# Patient Record
Sex: Male | Born: 1995 | Race: White | Hispanic: No | Marital: Single | Smoking: Current every day smoker
Health system: Southern US, Community
[De-identification: ages and names within clinical notes are randomized; demographics above are authoritative.]

---
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
## 2013-09-27 ENCOUNTER — Emergency Department (HOSPITAL_COMMUNITY)
Admission: EM | Admit: 2013-09-27 | Discharge: 2013-09-27 | Disposition: A | Discharge status: Home / Self Care | Payer: 59 | Attending: Emergency Medicine | Admitting: Emergency Medicine

## 2013-09-27 ENCOUNTER — Encounter (HOSPITAL_COMMUNITY): Payer: Self-pay | Admitting: Emergency Medicine

## 2013-09-27 DIAGNOSIS — F1110 Opioid abuse, uncomplicated: Secondary | ICD-10-CM | POA: Insufficient documentation

## 2013-09-27 DIAGNOSIS — F1410 Cocaine abuse, uncomplicated: Secondary | ICD-10-CM | POA: Insufficient documentation

## 2013-09-27 DIAGNOSIS — F1910 Other psychoactive substance abuse, uncomplicated: Secondary | ICD-10-CM

## 2013-09-27 DIAGNOSIS — IMO0001 Reserved for inherently not codable concepts without codable children: Secondary | ICD-10-CM | POA: Insufficient documentation

## 2013-09-27 DIAGNOSIS — R6883 Chills (without fever): Secondary | ICD-10-CM | POA: Insufficient documentation

## 2013-09-27 DIAGNOSIS — F17200 Nicotine dependence, unspecified, uncomplicated: Secondary | ICD-10-CM | POA: Insufficient documentation

## 2013-09-27 DIAGNOSIS — Z880 Allergy status to penicillin: Secondary | ICD-10-CM | POA: Insufficient documentation

## 2013-09-27 NOTE — ED Notes (Signed)
Pt states he wants detox from opiates.  Has been using for 1 year.  No prior detox.  Opiates last this morning.  No detox symptoms noted.

## 2013-09-27 NOTE — Discharge Instructions (Signed)
Please read and follow all provided instructions.  Your diagnoses today include:  1. Polysubstance abuse     Tests performed today include:  Vital signs. See below for your results today.   Medications prescribed:   None  Home care instructions:  Follow any educational materials contained in this packet.  Follow-up instructions: Follow-up with the referrals given to you for treatment.   Return instructions:   Please return to the Emergency Department if you experience worsening symptoms.   Please return if you have any other emergent concerns.  Additional Information:  Your vital signs today were: BP 118/69   Pulse 95   Temp(Src) 98 F (36.7 C) (Oral)   Resp 16   SpO2 99% If your blood pressure (BP) was elevated above 135/85 this visit, please have this repeated by your doctor within one month. ---------------

## 2013-09-27 NOTE — ED Provider Notes (Signed)
CSN: [id number]     Arrival date & time 09/27/13  1224 History  This chart was scribed for Rhea Bleacher PA-C  working with Merrie Roof, MD by Ashley Jacobs, ED scribe. This patient was seen in room [facility] and the patient's care was started at 1:28 PM.   First MD Initiated Contact with Patient 09/27/13 1256     Chief Complaint  Patient presents with  . Drug Problem     (Consider location/radiation/quality/duration/timing/severity/associated sxs/prior Treatment) Patient is a 18 y.o. male presenting with drug problem. The history is provided by the patient and medical records. No language interpreter was used.  Drug Problem Pertinent negatives include no chest pain, no abdominal pain and no headaches.   HPI Comments: Jeremy Fernandez is a 18 y.o. male who presents to the Emergency Department for detox from opiates. Pt has been using heroin and cocaine for the past year. Pt's last use was six hours ago. He injects into his arms. Denies any swelling or redness. Pt has generalized body aches and chills.  Denies prior detox treatment. Denies SI and HI. Denies fever, nausea and vomitnig.   History reviewed. No pertinent past medical history. Past Surgical History  Procedure Laterality Date  . Orif finger fracture  01/14/2011    Procedure: OPEN REDUCTION INTERNAL FIXATION (ORIF) METACARPAL (FINGER) FRACTURE;  Surgeon: Tami Ribas;  Location: [facility];  Service: Orthopedics;  Laterality: Right;  crpp vs orif rt small metacarpal fx    Family History  Problem Relation Age of Onset  . Asthma Mother   . Miscarriages / [place] Mother   . Hypertension Father   . Hyperlipidemia Father    History  Substance Use Topics  . Smoking status: Current Every Day Smoker -- 1.00 packs/day    Types: Cigarettes  . Smokeless tobacco: Not on file  . Alcohol Use: Yes    Review of Systems  Constitutional: Positive for chills. Negative for fever.  HENT: Negative for  rhinorrhea and sore throat.   Eyes: Negative for redness.  Respiratory: Negative for cough.   Cardiovascular: Negative for chest pain.  Gastrointestinal: Negative for nausea, vomiting, abdominal pain and diarrhea.  Genitourinary: Negative for dysuria.  Musculoskeletal: Positive for myalgias.  Skin: Positive for wound. Negative for color change and rash.  Neurological: Negative for headaches.  Psychiatric/Behavioral: Negative for suicidal ideas.       No HI       Allergies  Penicillins  Home Medications   Prior to Admission medications   Not on File   BP 118/69  Pulse 95  Temp(Src) 98 F (36.7 C) (Oral)  Resp 16  SpO2 99% Physical Exam  Nursing note and vitals reviewed. Constitutional: He appears well-developed and well-nourished.  HENT:  Head: Normocephalic and atraumatic.  Eyes: Conjunctivae are normal. Right eye exhibits no discharge. Left eye exhibits no discharge.  Neck: Normal range of motion. Neck supple.  Cardiovascular: Normal rate, regular rhythm and normal heart sounds.   Pulmonary/Chest: Effort normal and breath sounds normal.  Abdominal: Soft. There is no tenderness.  Neurological: He is alert.  Skin: Skin is warm and dry.  Track marks bilateral arms without signs of cellulitis or abscess.  Psychiatric: He has a normal mood and affect.    ED Course  Procedures (including critical care time) DIAGNOSTIC STUDIES: Oxygen Saturation is 99% on RA, normal by my interpretation.    COORDINATION OF CARE:  1:31 PM Discussed course of care with pt which includes a  consult with Behavioral Health . Pt understands and agrees.   Labs Review Labs Reviewed - No data to display  Imaging Review No results found.   EKG Interpretation None      Patient seen and examined. Nurse Johnny BridgeMartha called back to assessment team. Currently no inpatient detox for opiates alone. They have brought up resources for patient which I have provided to him.    Vital signs reviewed  and are as follows: Filed Vitals:   09/27/13 1246  BP: 118/69  Pulse: 95  Temp: 98 F (36.7 C)  Resp: 16   Patient informed of this discussion. He is strongly encouraged to call resources provided to arrange for treatment. Counseled patient on use of over-the-counter medications for withdrawal symptom relief.   MDM   Final diagnoses:  Polysubstance abuse   Patient with opiates and cocaine use. No alcohol use. He does not qualify for inpatient detox. [facility] No SI/HI.  I personally performed the services described in this documentation, which was scribed in my presence. The recorded information has been reviewed and is accurate.      Renne CriglerJoshua Avrielle Fry, PA-C 09/27/13 1404

## 2013-09-28 NOTE — ED Provider Notes (Signed)
Medical screening examination/treatment/procedure(s) were performed by non-physician practitioner and as supervising physician I was immediately available for consultation/collaboration.   Lara Palinkas David Alaya Iverson III, MD 09/28/13 0834 
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
## 2015-06-07 DEATH — deceased
